# Patient Record
Sex: Male | Born: 1935 | Race: White | Hispanic: No
Health system: Southern US, Community
[De-identification: ages and names within clinical notes are randomized; demographics above are authoritative.]

---

## 2011-01-20 ENCOUNTER — Inpatient Hospital Stay: Payer: Self-pay | Admitting: Specialist

## 2011-02-12 ENCOUNTER — Emergency Department: Payer: Self-pay | Admitting: Unknown Physician Specialty

## 2012-10-21 ENCOUNTER — Emergency Department: Payer: Self-pay | Admitting: Emergency Medicine

## 2012-10-21 LAB — CBC
HGB: 18.6 g/dL — ABNORMAL HIGH (ref 13.0–18.0)
MCH: 32.4 pg (ref 26.0–34.0)
MCHC: 34.3 g/dL (ref 32.0–36.0)
MCV: 95 fL (ref 80–100)
Platelet: 176 10*3/uL (ref 150–440)
RBC: 5.74 10*6/uL (ref 4.40–5.90)
RDW: 12.9 % (ref 11.5–14.5)

## 2012-10-21 LAB — COMPREHENSIVE METABOLIC PANEL
Albumin: 4.2 g/dL (ref 3.4–5.0)
Anion Gap: 9 (ref 7–16)
Calcium, Total: 9.4 mg/dL (ref 8.5–10.1)
Chloride: 103 mmol/L (ref 98–107)
Co2: 26 mmol/L (ref 21–32)
Creatinine: 1.21 mg/dL (ref 0.60–1.30)
EGFR (African American): >60
Glucose: 99 mg/dL (ref 65–99)
Osmolality: 278 (ref 275–301)
Potassium: 4.3 mmol/L (ref 3.5–5.1)
Sodium: 138 mmol/L (ref 136–145)
Total Protein: 8.5 g/dL — ABNORMAL HIGH (ref 6.4–8.2)

## 2012-11-26 LAB — CBC
HCT: 51.1 % (ref 40.0–52.0)
HGB: 17.4 g/dL (ref 13.0–18.0)
MCH: 31.8 pg (ref 26.0–34.0)
MCHC: 34.1 g/dL (ref 32.0–36.0)
MCV: 93 fL (ref 80–100)
Platelet: 172 10*3/uL (ref 150–440)
RBC: 5.47 10*6/uL (ref 4.40–5.90)
WBC: 12 10*3/uL — ABNORMAL HIGH (ref 3.8–10.6)

## 2012-11-26 LAB — COMPREHENSIVE METABOLIC PANEL
Alkaline Phosphatase: 94 U/L (ref 50–136)
Anion Gap: 8 (ref 7–16)
Chloride: 106 mmol/L (ref 98–107)
Co2: 24 mmol/L (ref 21–32)
Glucose: 121 mg/dL — ABNORMAL HIGH (ref 65–99)
Osmolality: 278 (ref 275–301)
Potassium: 3.7 mmol/L (ref 3.5–5.1)
SGPT (ALT): 25 U/L (ref 12–78)
Sodium: 138 mmol/L (ref 136–145)

## 2012-11-26 LAB — CK TOTAL AND CKMB (NOT AT ARMC)
CK, Total: 83 U/L (ref 35–232)
CK-MB: 2.5 ng/mL (ref 0.5–3.6)

## 2012-11-26 LAB — URINALYSIS, COMPLETE
Glucose,UR: NEGATIVE mg/dL (ref 0–75)
Ketone: NEGATIVE
Protein: 100
Specific Gravity: 1.011 (ref 1.003–1.030)
Squamous Epithelial: NONE SEEN
WBC UR: 392 /HPF (ref 0–5)

## 2012-11-26 LAB — TROPONIN I: Troponin-I: 0.2 ng/mL — ABNORMAL HIGH

## 2012-11-27 ENCOUNTER — Inpatient Hospital Stay: Payer: Self-pay | Admitting: Internal Medicine

## 2012-11-27 LAB — BASIC METABOLIC PANEL
Calcium, Total: 8.3 mg/dL — ABNORMAL LOW (ref 8.5–10.1)
Co2: 25 mmol/L (ref 21–32)
Creatinine: 1.19 mg/dL (ref 0.60–1.30)
EGFR (Non-African Amer.): 59 — ABNORMAL LOW
Glucose: 149 mg/dL — ABNORMAL HIGH (ref 65–99)

## 2012-11-27 LAB — CBC WITH DIFFERENTIAL/PLATELET
Basophil %: 1 %
Lymphocyte #: 2.2 10*3/uL (ref 1.0–3.6)
Lymphocyte %: 20.5 %
MCH: 32.1 pg (ref 26.0–34.0)
MCHC: 34 g/dL (ref 32.0–36.0)
MCV: 94 fL (ref 80–100)
Monocyte #: 0.8 x10 3/mm (ref 0.2–1.0)
Monocyte %: 6.9 %
RBC: 5.17 10*6/uL (ref 4.40–5.90)
RDW: 13.2 % (ref 11.5–14.5)
WBC: 10.9 10*3/uL — ABNORMAL HIGH (ref 3.8–10.6)

## 2012-11-27 LAB — LIPID PANEL
Cholesterol: 165 mg/dL (ref 0–200)
Ldl Cholesterol, Calc: 104 mg/dL — ABNORMAL HIGH (ref 0–100)
VLDL Cholesterol, Calc: 36 mg/dL (ref 5–40)

## 2012-11-27 LAB — TROPONIN I
Troponin-I: 0.22 ng/mL — ABNORMAL HIGH
Troponin-I: 0.15 ng/mL — ABNORMAL HIGH

## 2012-11-27 LAB — CK TOTAL AND CKMB (NOT AT ARMC): CK, Total: 99 U/L (ref 35–232)

## 2012-11-27 LAB — MAGNESIUM: Magnesium: 1.8 mg/dL

## 2012-11-28 LAB — BASIC METABOLIC PANEL
Anion Gap: 10 (ref 7–16)
BUN: 20 mg/dL — ABNORMAL HIGH (ref 7–18)
Calcium, Total: 8.5 mg/dL (ref 8.5–10.1)
Chloride: 103 mmol/L (ref 98–107)
Co2: 23 mmol/L (ref 21–32)
Creatinine: 1.47 mg/dL — ABNORMAL HIGH (ref 0.60–1.30)
EGFR (African American): 53 — ABNORMAL LOW
EGFR (Non-African Amer.): 46 — ABNORMAL LOW
Osmolality: 278 (ref 275–301)
Potassium: 4.3 mmol/L (ref 3.5–5.1)
Sodium: 136 mmol/L (ref 136–145)

## 2012-11-29 LAB — URINE CULTURE

## 2012-11-29 LAB — CREATININE, SERUM
EGFR (African American): 54 — ABNORMAL LOW
EGFR (Non-African Amer.): 46 — ABNORMAL LOW

## 2012-12-08 LAB — URINALYSIS, COMPLETE
Glucose,UR: NEGATIVE mg/dL (ref 0–75)
Nitrite: NEGATIVE
RBC,UR: 88 /HPF (ref 0–5)
Squamous Epithelial: <1

## 2012-12-08 LAB — COMPREHENSIVE METABOLIC PANEL
Albumin: 3.2 g/dL — ABNORMAL LOW (ref 3.4–5.0)
Alkaline Phosphatase: 61 U/L (ref 50–136)
Anion Gap: 8 (ref 7–16)
Calcium, Total: 9.3 mg/dL (ref 8.5–10.1)
Chloride: 99 mmol/L (ref 98–107)
Co2: 26 mmol/L (ref 21–32)
Creatinine: 1.37 mg/dL — ABNORMAL HIGH (ref 0.60–1.30)
EGFR (African American): 58 — ABNORMAL LOW
EGFR (Non-African Amer.): 50 — ABNORMAL LOW
Glucose: 89 mg/dL (ref 65–99)
Osmolality: 269 (ref 275–301)
Potassium: 4.2 mmol/L (ref 3.5–5.1)
SGOT(AST): 15 U/L (ref 15–37)
Total Protein: 7.8 g/dL (ref 6.4–8.2)

## 2012-12-08 LAB — CBC
HGB: 16 g/dL (ref 13.0–18.0)
MCHC: 35.1 g/dL (ref 32.0–36.0)
Platelet: 162 10*3/uL (ref 150–440)
RBC: 4.89 10*6/uL (ref 4.40–5.90)
RDW: 13.1 % (ref 11.5–14.5)
WBC: 13.6 10*3/uL — ABNORMAL HIGH (ref 3.8–10.6)

## 2012-12-08 LAB — TROPONIN I: Troponin-I: <0.02 ng/mL

## 2012-12-09 ENCOUNTER — Inpatient Hospital Stay: Payer: Self-pay | Admitting: Student

## 2012-12-10 LAB — CBC WITH DIFFERENTIAL/PLATELET
Basophil #: 0 10*3/uL (ref 0.0–0.1)
Basophil %: 0 %
Eosinophil #: 0 10*3/uL (ref 0.0–0.7)
Eosinophil %: 0 %
HCT: 40.6 % (ref 40.0–52.0)
Lymphocyte #: 0.5 10*3/uL — ABNORMAL LOW (ref 1.0–3.6)
Monocyte #: 0.5 x10 3/mm (ref 0.2–1.0)
Monocyte %: 3.2 %
Neutrophil #: 14.8 10*3/uL — ABNORMAL HIGH (ref 1.4–6.5)
Platelet: 160 10*3/uL (ref 150–440)
RDW: 13.2 % (ref 11.5–14.5)
WBC: 15.8 10*3/uL — ABNORMAL HIGH (ref 3.8–10.6)

## 2012-12-10 LAB — COMPREHENSIVE METABOLIC PANEL
Albumin: 2.9 g/dL — ABNORMAL LOW (ref 3.4–5.0)
Anion Gap: 7 (ref 7–16)
Bilirubin,Total: 0.4 mg/dL (ref 0.2–1.0)
Chloride: 98 mmol/L (ref 98–107)
Co2: 26 mmol/L (ref 21–32)
Creatinine: 1.97 mg/dL — ABNORMAL HIGH (ref 0.60–1.30)
EGFR (African American): 37 — ABNORMAL LOW
Glucose: 154 mg/dL — ABNORMAL HIGH (ref 65–99)
Potassium: 4.5 mmol/L (ref 3.5–5.1)
Sodium: 131 mmol/L — ABNORMAL LOW (ref 136–145)

## 2012-12-11 LAB — BASIC METABOLIC PANEL
Anion Gap: 5 — ABNORMAL LOW (ref 7–16)
Creatinine: 1.44 mg/dL — ABNORMAL HIGH (ref 0.60–1.30)
EGFR (Non-African Amer.): 47 — ABNORMAL LOW
Glucose: 149 mg/dL — ABNORMAL HIGH (ref 65–99)
Osmolality: 283 (ref 275–301)
Potassium: 4.8 mmol/L (ref 3.5–5.1)

## 2012-12-11 LAB — CBC WITH DIFFERENTIAL/PLATELET
Basophil #: 0.1 10*3/uL (ref 0.0–0.1)
Basophil %: 0.5 %
Eosinophil %: 0 %
Lymphocyte #: 0.5 10*3/uL — ABNORMAL LOW (ref 1.0–3.6)
MCHC: 34.5 g/dL (ref 32.0–36.0)
Monocyte #: 0.4 x10 3/mm (ref 0.2–1.0)
Monocyte %: 3.2 %
Neutrophil #: 11.7 10*3/uL — ABNORMAL HIGH (ref 1.4–6.5)
Neutrophil %: 92.7 %
RBC: 4.34 10*6/uL — ABNORMAL LOW (ref 4.40–5.90)
WBC: 12.6 10*3/uL — ABNORMAL HIGH (ref 3.8–10.6)

## 2012-12-12 LAB — BASIC METABOLIC PANEL
Anion Gap: 5 — ABNORMAL LOW (ref 7–16)
Calcium, Total: 8.7 mg/dL (ref 8.5–10.1)
Chloride: 108 mmol/L — ABNORMAL HIGH (ref 98–107)
Creatinine: 1.27 mg/dL (ref 0.60–1.30)
EGFR (African American): >60
EGFR (Non-African Amer.): 55 — ABNORMAL LOW
Glucose: 98 mg/dL (ref 65–99)
Osmolality: 280 (ref 275–301)
Sodium: 137 mmol/L (ref 136–145)

## 2013-01-01 ENCOUNTER — Ambulatory Visit: Payer: Self-pay | Admitting: Nurse Practitioner

## 2013-01-31 ENCOUNTER — Ambulatory Visit: Payer: Self-pay | Admitting: Nurse Practitioner

## 2013-01-31 DEATH — deceased

## 2013-07-23 IMAGING — US US RENAL KIDNEY
1 series · 14 of 25 positions shown · non-contrast
Comparison: none

REASON FOR EXAM: urinary retention
COMMENTS:

[Series 1: us renal kidney · 0.25mm/px · 14 of 40 slices shown]
[im 1/40]
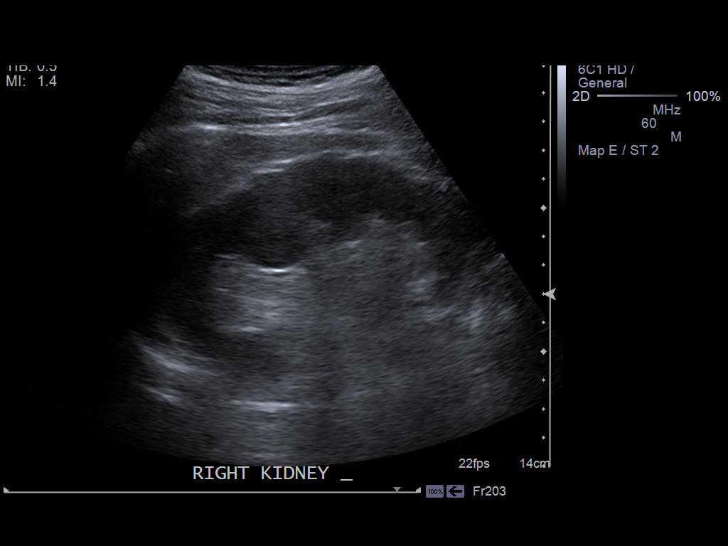
[im 4/40]
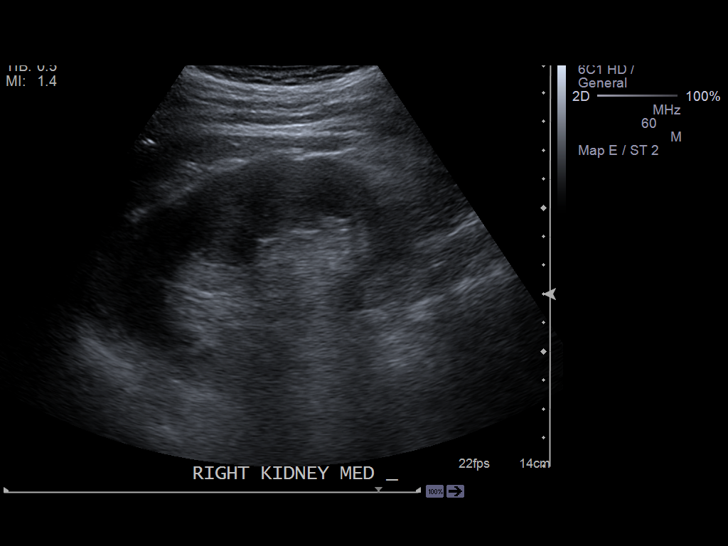
[im 7/40]
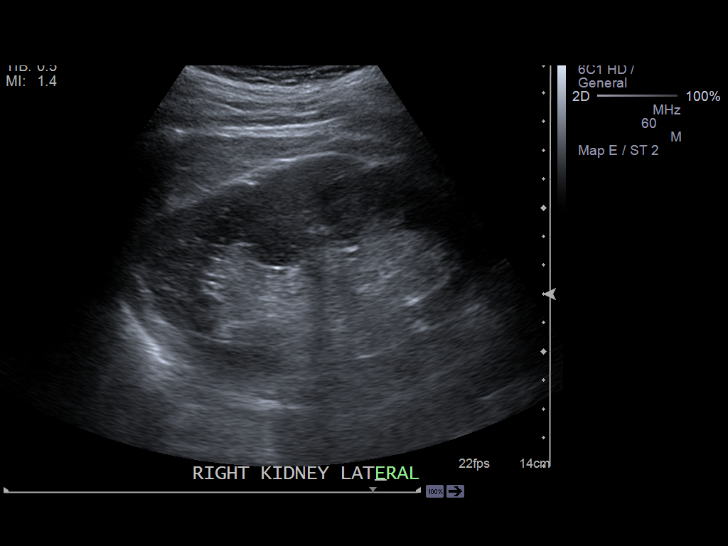
[im 10/40]
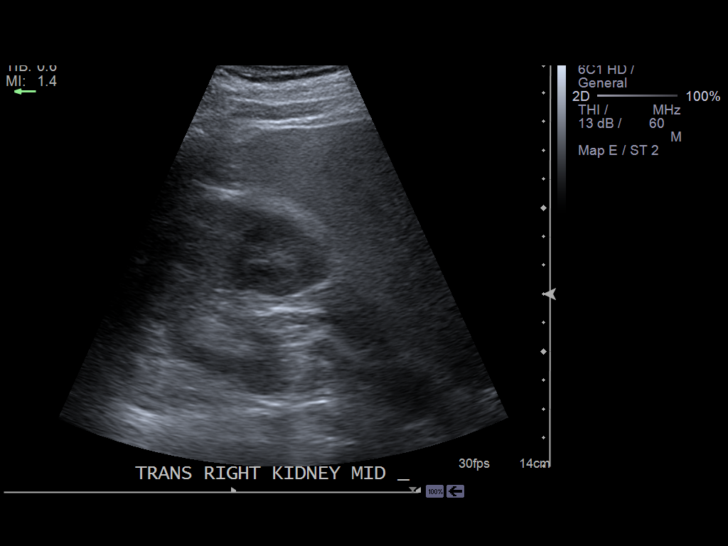
[im 14/40]
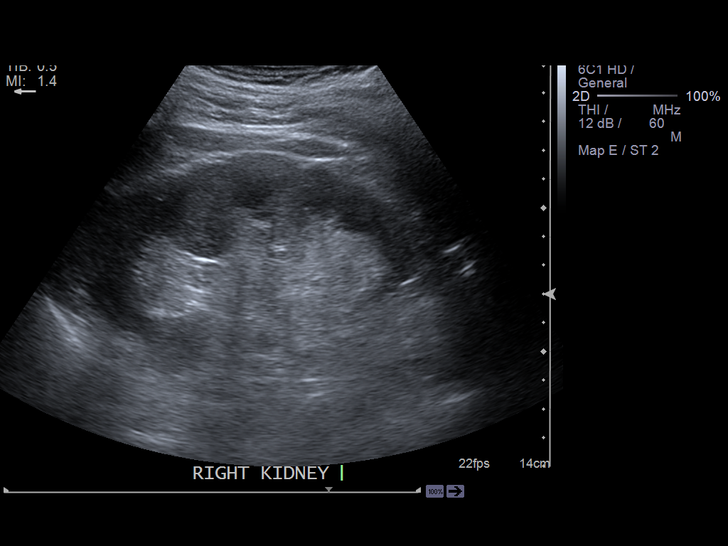
[im 15/40]
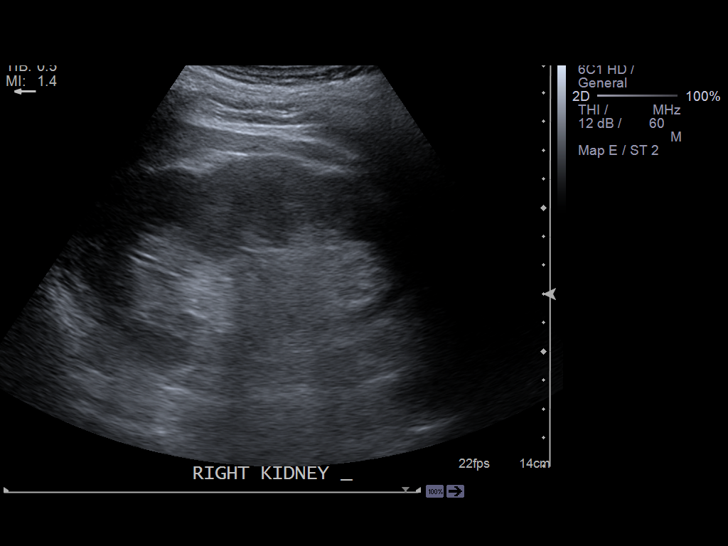
[im 18/40]
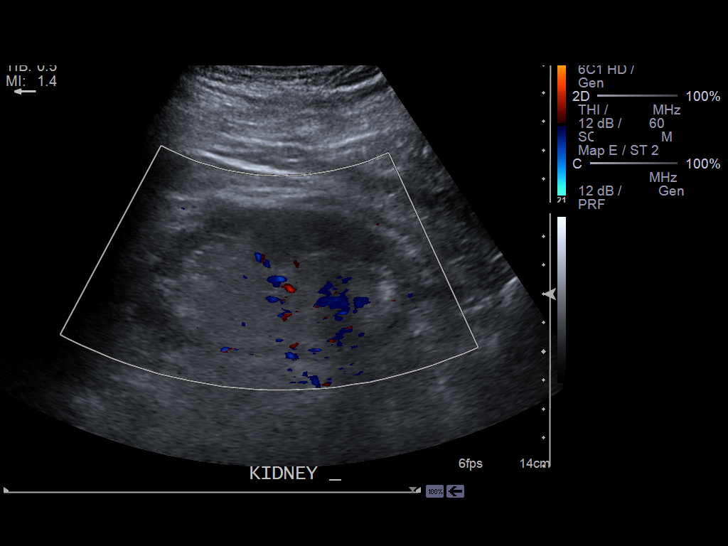
[im 22/40]
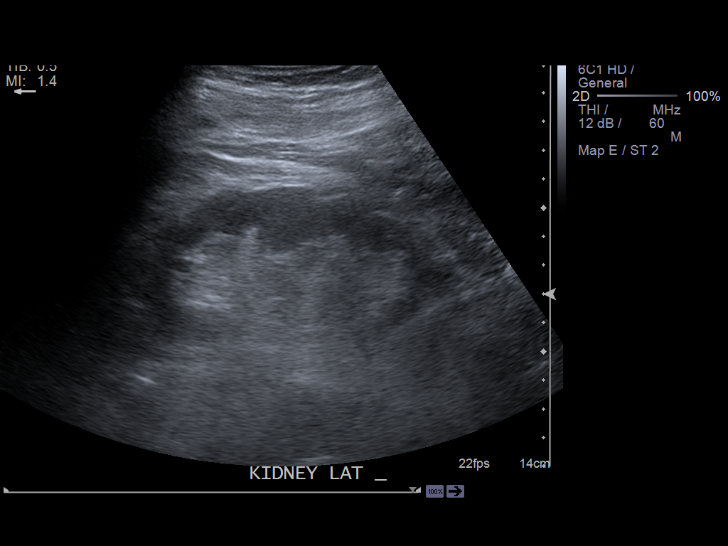
[im 25/40]
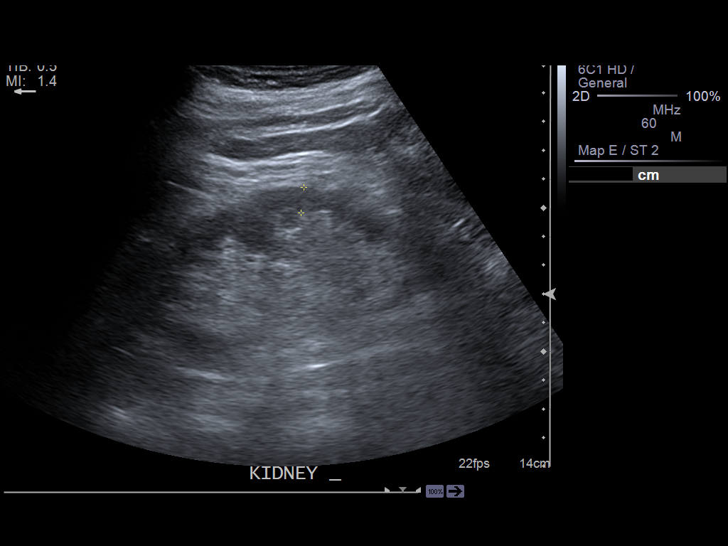
[im 27/40]
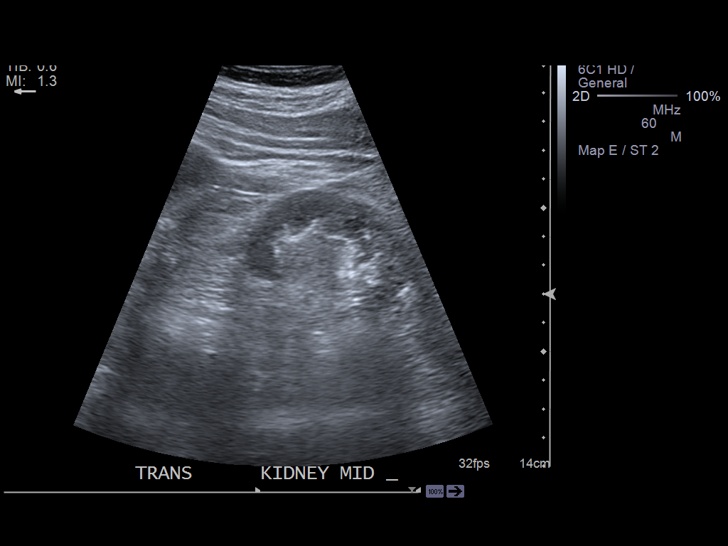
[im 30/40]
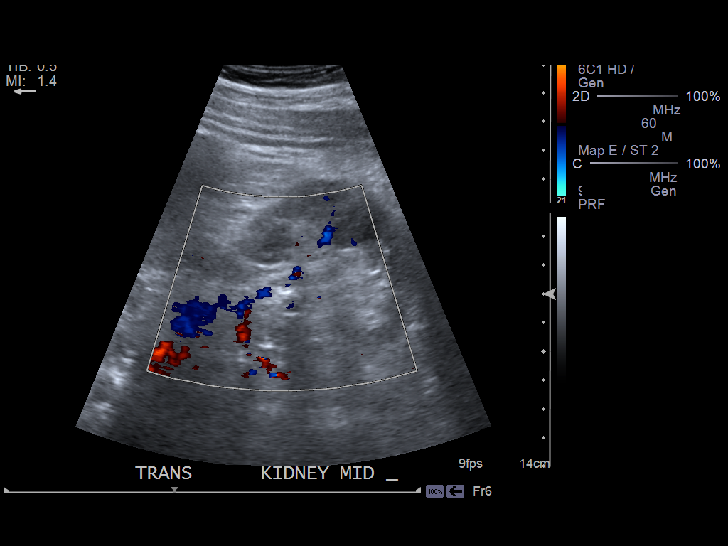
[im 33/40]
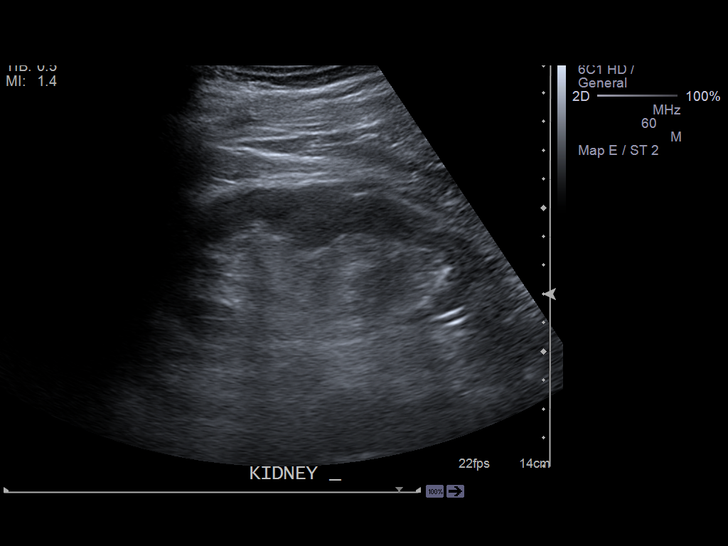
[im 36/40]
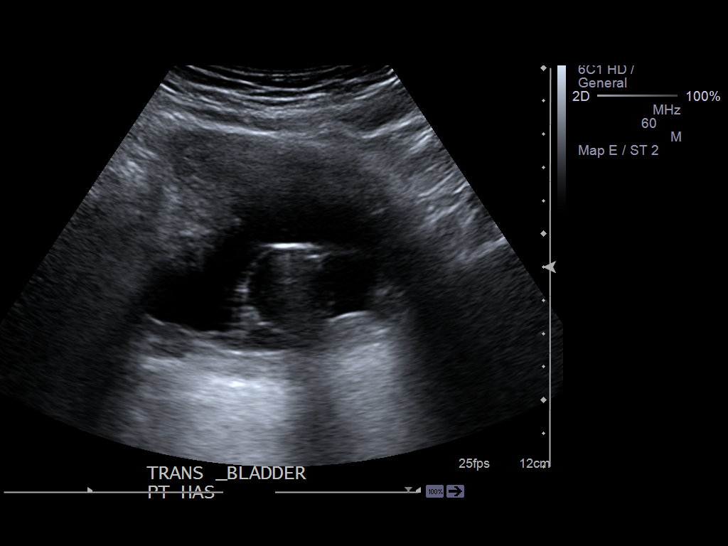
[im 40/40]
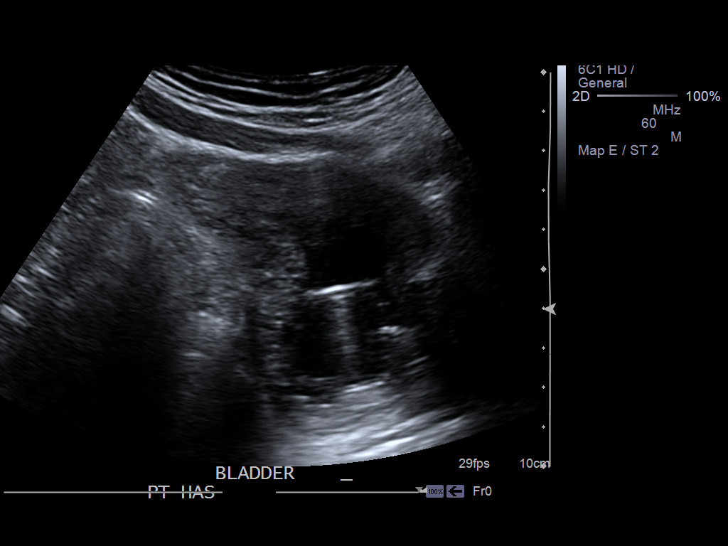

[14 of 25 positions shown; findings below may reference images not displayed]

PROCEDURE:     US  - US KIDNEY  - November 27, 2012 [DATE]

RESULT:     Comparison: None

Technique and findings: Multiple gray-scale and color doppler images of the
kidneys were obtained.

The right kidney measures 11.3 x 5 x 5.8 cm and the left kidney measures 11
x 3.7 x 5.6 cm. And the right kidney is normal in echogenicity. The left
kidney is slightly increased in echogenicity. There is mild left renal
cortical thinning. There is no hydronephrosis.  There are no echogenic foci.
 There are no renal masses. There is no free fluid in the region of the
renal fossa.

The bladder is nondistended limiting evaluation. There is a Foley catheter
present within the bladder.
IMPRESSION: Mild left renal atrophy. No obstructive uropathy.

[REDACTED]

## 2013-08-03 IMAGING — CT CT CHEST W/ CM
1 series · 15 of 32 positions shown, 19 images · IV contrast (APPLIED)
Comparison: None

REASON FOR EXAM: dyspnea, cough, tachypnea
COMMENTS:

PROCEDURE:     CT  - CT CHEST (FOR PE) W  - December 08, 2012  [DATE]
RESULT:      Indications: Chest Pain
TECHNIQUE: A thin-section spiral CT from the lung apices to the upper
abdomen was acquired on a multi slice scanner following 100ml [id number]
intravenous contrast. These images were then transferred to the Siemens work
station and were subsequently reviewed utilizing 3-D reconstructions and MIP
images.

[Series 4: soft tissue · axial · 0.76mm/px · z∈[+688,+970]mm · 15 of 106 slices shown, 19 images]
[im 8/106  mediastinal]
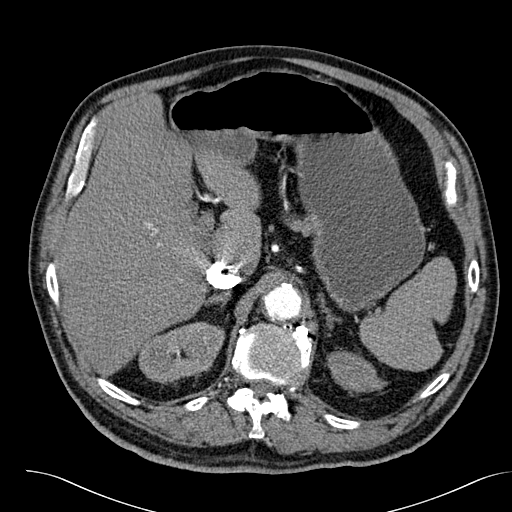
[im 8/106  lung]
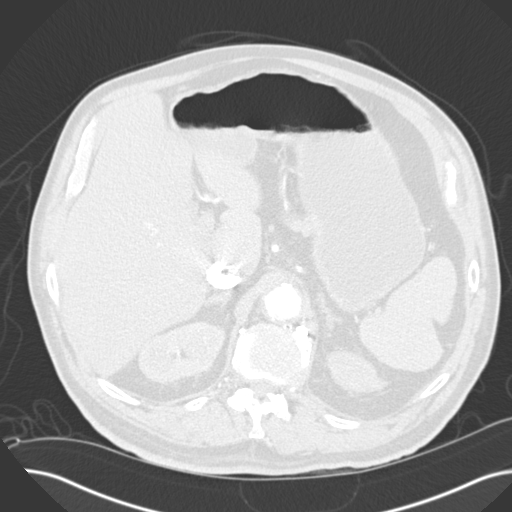
[im 16/106  lung]
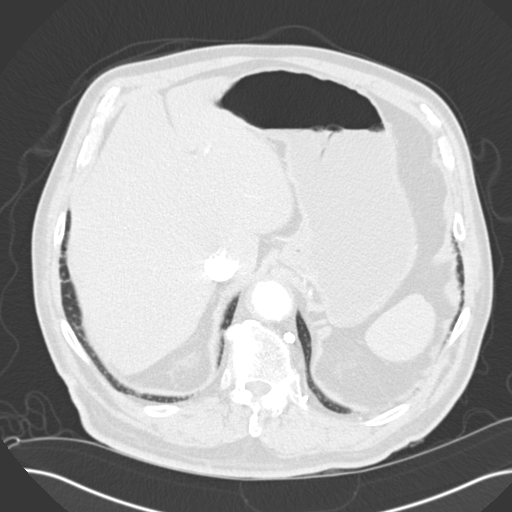
[im 22/106  lung]
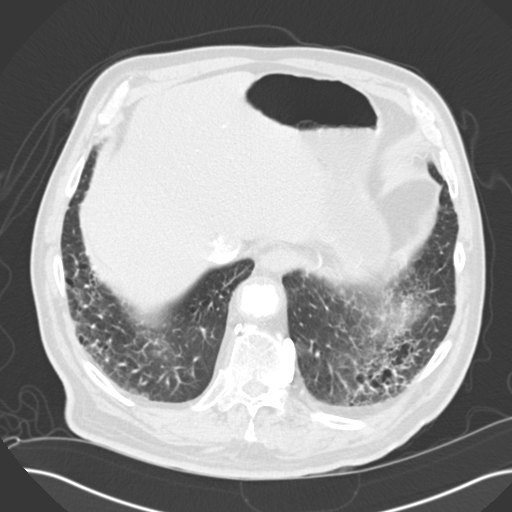
[im 28/106  lung]
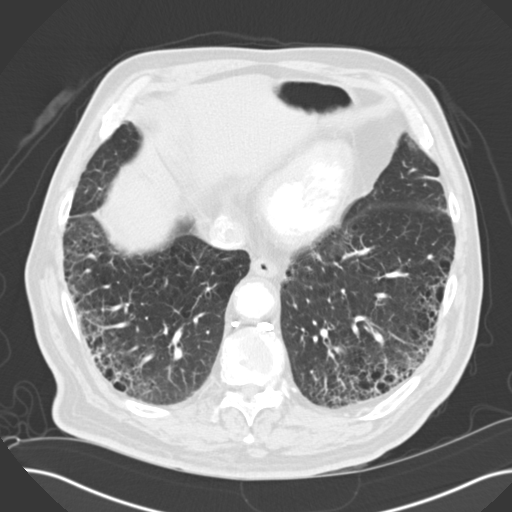
[im 36/106  mediastinal]
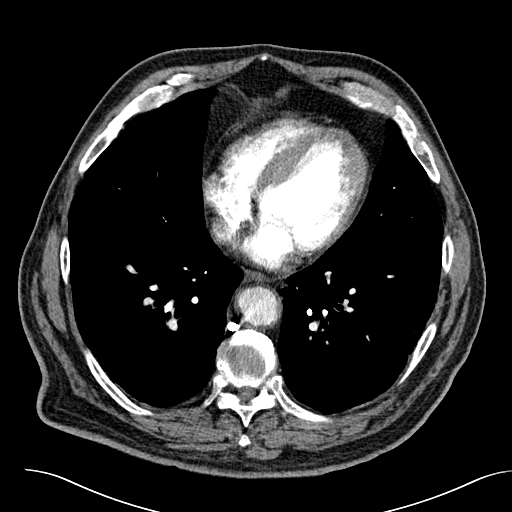
[im 36/106  lung]
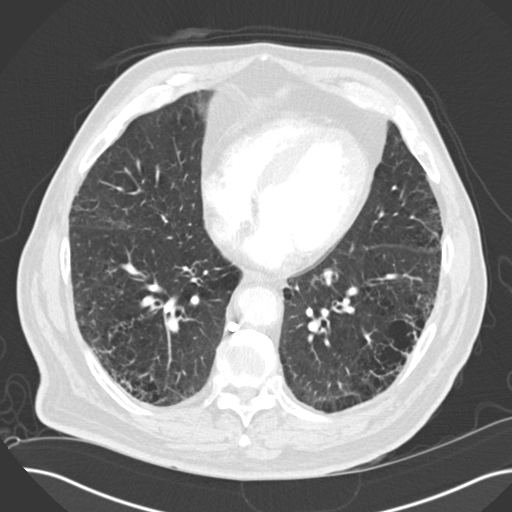
[im 43/106  lung]
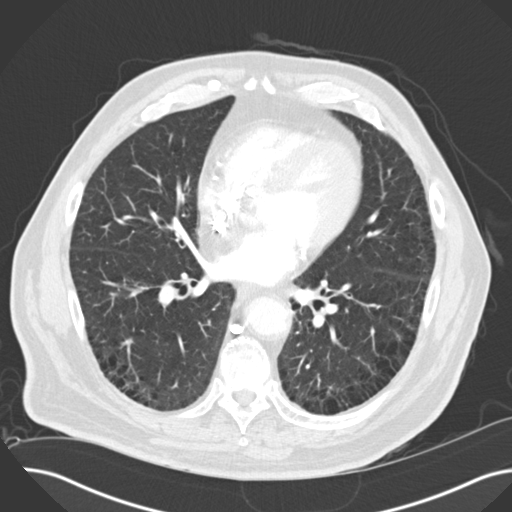
[im 51/106  lung]
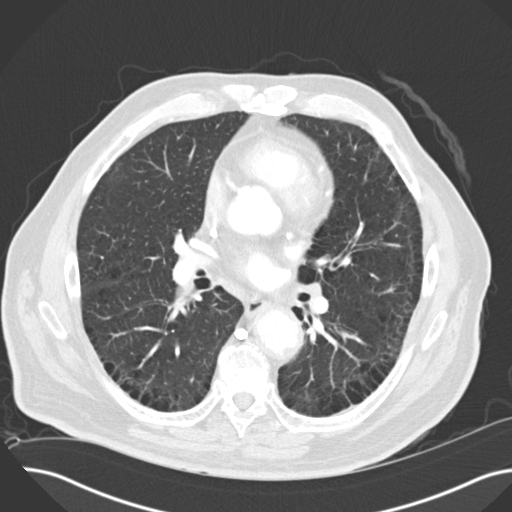
[im 56/106  lung]
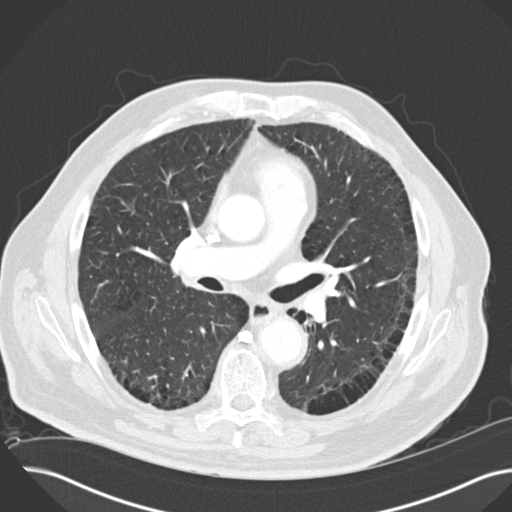
[im 63/106  mediastinal]
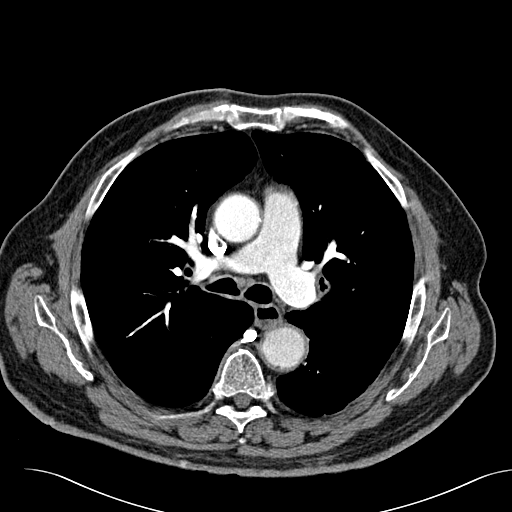
[im 63/106  lung]
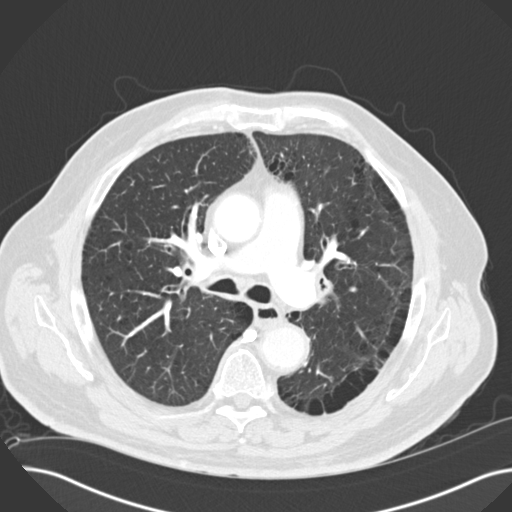
[im 67/106  lung]
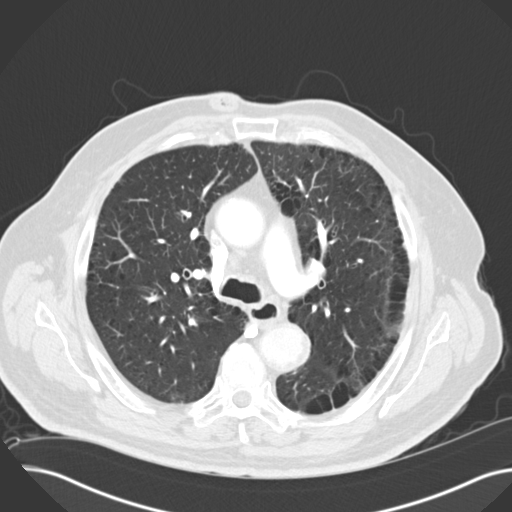
[im 74/106  lung]
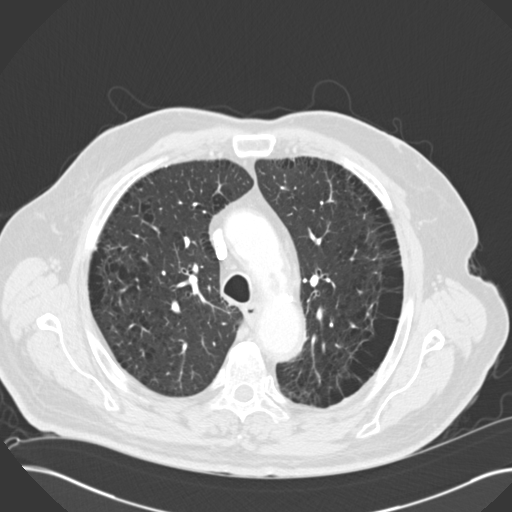
[im 82/106  lung]
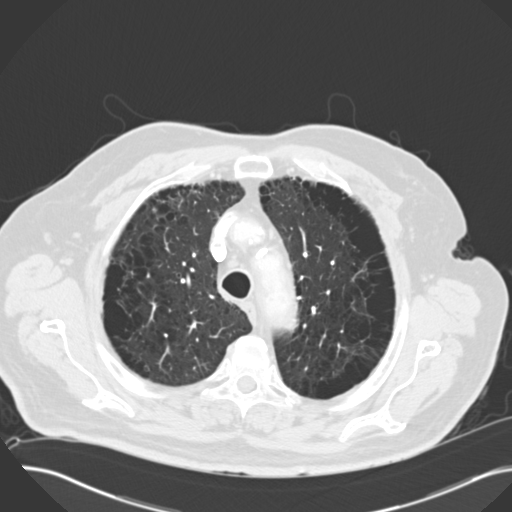
[im 86/106  mediastinal]
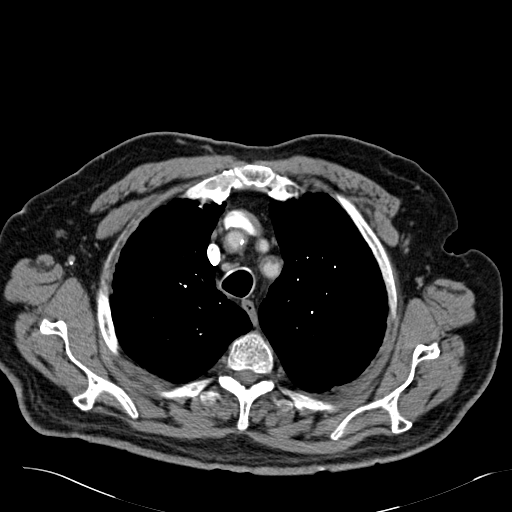
[im 86/106  lung]
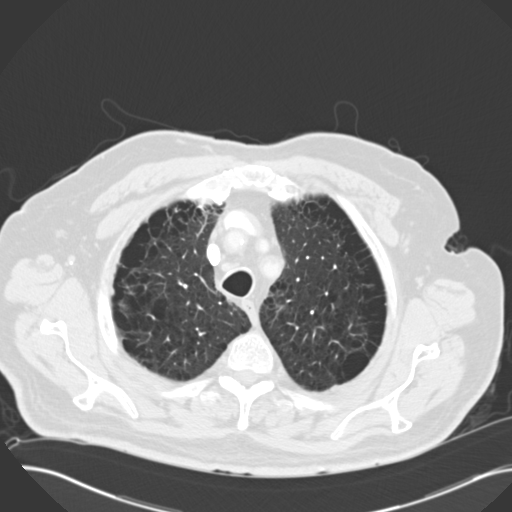
[im 94/106  lung]
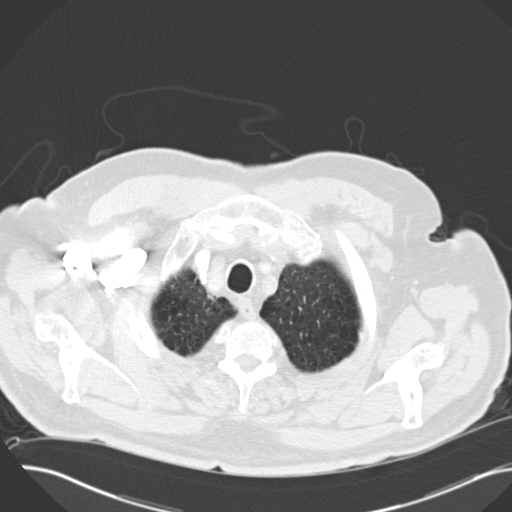
[im 102/106  lung]
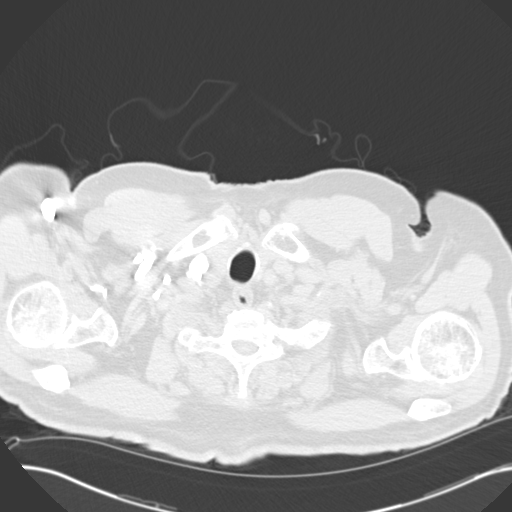

[15 of 32 positions shown; findings below may reference images not displayed]

FINDINGS: There is suboptimal opacification of the pulmonary arteries for evaluation
of pulmonary embolus. The main pulmonary artery, right main pulmonary
artery, and left main pulmonary arteries are normal in size. The heart size
is normal. There is no pericardial effusion. There is severe carotid artery
atherosclerosis.

There are bilateral emphysematous changes. There is bilateral subpleural
cystic change, left greater than right. There is bibasilar interstitial
fibrosis. There is no focal consolidation, pleural effusion, or pneumothorax.

There is no axillary, hilar, or mediastinal adenopathy.

The osseous structures are unremarkable.

There is a 2 cm nodule with air within it in the subcutaneous fat of the
anterior chest wall just to the right of the sternum which may represent an
infected sebaceous cyst; correlate with clinical exam.

The visualized portions of the upper abdomen are unremarkable.
IMPRESSION: 1. There is suboptimal opacification of the pulmonary arteries for
evaluation of pulmonary embolus.

2. Bilateral emphysematous changes.

3. Bilateral interstitial fibrosis.

4. Coronary artery disease.

5. There is a 2 cm nodule with air within it in the subcutaneous fat of the
anterior chest wall just to the right of the sternum which may represent an
infected sebaceous cyst; correlate with clinical exam.

[REDACTED]

## 2014-11-23 NOTE — H&P (Signed)
PATIENT NAME:  Omar Newton, Omar Newton MR#:  [id number] DATE OF BIRTH:  [birth year]  DATE OF ADMISSION:  11/27/2012  PRIMARY CARE PHYSICIAN:  Nonlocal.   CARDIOLOGY:  Dr. Gwen Pounds.   REFERRING PHYSICIAN:  Dr. Joselyn Glassman.   HISTORY OF PRESENT ILLNESS:  The patient is a 81 year old male with a past medical history of acute MI in the past status post stents, sees Dr. Gwen Pounds as an outpatient, is presenting to the ER with a chief complaint of progressively worsening of generalized weakness.  Lately he has been coughing more and lying in the bed.  According to the daughter he cannot get around as he used to do in the past.  The patient denies any chest pain, abdominal pain, nausea, vomiting or weight gain.  He has chronic shortness of breath and he is reporting that he is at his baseline.  The patient admits that he still smokes 2 packs of cigarettes per day.  The patient's urinalysis has revealed 3+ leukocyte esterase and negative nitrate.  His white count is elevated at 12.0.  Troponin is elevated at 0.20.  CPK MB and CK total are normal.  A 12-lead EKG has revealed new left anterior fascicular block which is somewhat new when compared to the old EKG from year 2012.  Hospitalist team is called to admit the patient for elevated troponin with generalized weakness and acute cystitis.  The patient has received IV Rocephin in the ER.  During my examination, patient denies any chest pain or abdominal pain.  Just feeling weak and tired.   The patient's daughter is reporting that he ran out of his medications approximately 2 to 3 weeks ago and not taking his medications.  The patient is admitting some financial issues to get his medications.  PAST MEDICAL HISTORY:  Coronary artery disease status post myocardial infarction x 2 and status post stent.  Hypertension, benign prostatic hypertrophy, neuropathy, hyperlipidemia, GERD and obesity.   PAST SURGICAL HISTORY:  Status post stents in the past.   ALLERGIES:  He has no known  drug allergies.   HOME MEDICATIONS:  Tamsulosin 0.4 mg 1 capsule by  mouth once daily, ranitidine 150 mg 2 times a day, Percocet 10/325 1 tablet by mouth q. 6 hours, gemfibrozil 600 mg 2 times a day, gabapentin 300 mg 3 times a day, furosemide 20 mg 1 tablet once daily, Avodart 0.5 mg 1 capsule by mouth once daily, atenolol 20 mg once daily, aspirin 81 mg once daily.   PSYCHOSOCIAL HISTORY:  Lives at home with the daughter, smokes 2 packs a day.  Denies any street drugs.  Denies any alcohol.   FAMILY HISTORY:  Diabetes runs in his family.  Both parents are deceased.   REVIEW OF SYSTEMS:  CONSTITUTIONAL:  Complaining of fatigue and generalized weakness.  No weight loss or weight gain.  EYES:  No blurry vision, glaucoma, cataracts.  EARS, NOSE, THROAT:  Denies any epistaxis, discharge, snoring, postnasal drip.  RESPIRATION:  Has chronic shortness of breath, problems with worsening of cough, chronic history of COPD is present.  CARDIOVASCULAR:  No chest pain, palpitations, syncope.  GASTROINTESTINAL:  No nausea, vomiting, diarrhea.  Has history of GERD.  GENITOURINARY:  No dysuria, hematuria, renal calculi. ENDOCRINE:  No polyuria, nocturia, thyroid problems.  Denies any heat or cold intolerance.  HEMATOLOGIC AND LYMPHATIC:  No anemia, easy bruising or bleeding.  INTEGUMENTARY:  No acne, rash.  The patient has decubitus ulcers stage I to II on his lower extremities on the pressure  point area.  NEUROLOGY:  No vertigo, ataxia, dementia, dysarthria, weakness.  PSYCHIATRY:  No anxiety, insomnia, ADD or OCD.   PHYSICAL EXAMINATION: VITAL SIGNS:  Temperature 97.4, pulse 88, respirations 20, blood pressure is 137/60, pulse ox 95%.  GENERAL APPEARANCE:  Not under acute distress.  Moderately built and moderately nourished.  HEENT:  Normocephalic, atraumatic.  Pupils are equally reacting to light and accommodation.  No scleral icterus.  No conjunctival injection.  Extraocular movements are intact.  No  sinus tenderness.  No postnasal drip.  No pharyngeal exudates.  NECK:  Supple.  No JVD.  No thyromegaly.  No lymphadenopathy.  LUNGS:  Course bronchial breath sounds are present.  Positive crackles, more on the right.  No anterior chest wall tenderness.  No accessory muscle usage.  CARDIOVASCULAR:  S1, S2 normal.  Regular rate and rhythm.  No edema.  GASTROINTESTINAL:  Soft.  Bowel sounds are positive in all four quadrants.  Nontender, nondistended.  No masses felt.  No hepatosplenomegaly.  NEUROLOGIC:  Awake, alert, oriented x 3.  Cranial nerves II through XII are grossly intact.  Motor and sensory are grossly intact.  Reflexes are 2+.  INTEGUMENTARY:  No rashes.  The right lateral malleolus of the foot healing pressure ulcer is noticed with surrounding erythema, also stage I ulcers are noticed on both feet on the pressure point areas.  Skin is warm to touch.  Normal turgor.  EXTREMITIES:  No cyanosis.  No clubbing.  No edema.  PSYCHIATRIC:  Normal mood and affect.  MUSCULOSKELETAL:  No joint effusion, tenderness or erythema.   LABORATORY AND IMAGING STUDIES:  Chest x-ray, early developing infiltrate on the left side of the chest is noticed.  CP angles are intact, chronic COPD changes were noticed.  A 12-lead EKG has revealed sinus rhythm with occasional premature ventricular complexes, heart rate is 89 beats per minute.  The patient has persistent right bundle branch block when compared to the old EKG.  Left anterior fascicular block is noticed which is somewhat new when compared to the old EKG.  No acute ST elevations or depressions noticed.  The patient's BUN is 17, creatinine is 1.23.  Glucose 121, sodium 138, potassium 3.7, chloride 106, CO2 24, anion gap is 8, GFR is 57.  Serum osmolality 278, calcium 8.8.  CK total 83, CPK-MB 2.5.  Troponin 0.20.  LFTs are within normal range.  WBC is elevated at 12.0, hemoglobin 17.4, hematocrit 51.1, platelet count is 172.  Urinalysis yellow in color, cloudy in  appearance.  Glucose, bilirubin, ketones are negative, specific gravity 1.011, nitrites negative, leukocyte esterase 3+, bacteria 1+.  WBC clumps are present.   ASSESSMENT AND PLAN:  A 81 year old male presenting to the ER with a chief complaint of progressively worsening weakness and cough.  He admits that he ran out of medication for the past 2 to 3 weeks and he could not buy them given the financial issues. 1.  Generalized weakness, probably from acute cystitis and early developing pneumonia.  Plan is to put him on telemetry.  We will give him IV Rocephin and Zithromax.  Pan cultures were ordered.  We will provide him inhalers as-needed basis.  2.  Elevated troponin with a history of acute coronary syndrome, status post myocardial infarction and stent, probably from demand ischemia.  Given the history of myocardial infarction in the past x 2 and stents, we will cycle cardiac biomarkers.  We will implement acute coronary syndrome protocol with oxygen, nitroglycerin, aspirin, beta-blocker and statin.  A 2-D echocardiogram is ordered.  Cardiology consult is placed to Dr. Gwen Pounds.  The patient has received one dose of Lovenox 1 mg/kg subQ in the ER while pending cardiac biomarker cycling.  3.  Hypertension.  Blood pressure is stable.  We will resume his home medication.  4.  Chronic obstructive pulmonary disease.  The patient was counseled to quit smoking.  Currently chronic obstructive pulmonary disease is stable.  We will provide him inhalers as needed basis for shortness of breath.  5.  Hyperlipidemia.  Continue his home medication gemfibrozil.  We will give him statin and check fasting lipid panel.  6.  Gastroesophageal reflux disease.  The patient will be receiving ranitidine.  7.  Benign prostatic hypertrophy, stable.  Continue his home medications.  8.  Congestive heart failure.  Resume his home medication Lasix.  Currently, patient is not fluid-overloaded.  We will obtain 2-D echocardiogram to  evaluate his ejection fraction.  9.  Gastrointestinal prophylaxis with ranitidine and deep vein thrombosis prophylaxis with Lovenox.  10.  HE IS FULL CODE.   The patient's daughter is his medical power of attorney.   Total time spent on the admission is 50 minutes.     ____________________________ Ramonita Lab, MD ag:ea D: 11/27/2012 00:56:56 ET T: 11/27/2012 02:08:45 ET JOB#: [id number]  cc: Ramonita Lab, MD, <Dictator> Dr. Silvio Clayman MD ELECTRONICALLY SIGNED 12/02/2012 5:14

## 2014-11-23 NOTE — H&P (Signed)
PATIENT NAME:  Omar Newton, Omar Newton MR#:  [id number] DATE OF BIRTH:  Mar 12, 1934  DATE OF ADMISSION:  12/09/2012  PRIMARY CARE PHYSICIAN:  Nonlocal.  REFERRING PHYSICIAN:  Dr. Manson Passey.  CARDIOLOGY:  Dr. Gwen Pounds.  CHIEF COMPLAINT:  Shortness of breath associated with generalized weakness and coughing.    HISTORY OF PRESENT ILLNESS:  The patient is a 81 year old male with a chronic history of COPD, still smokes 2 packs, presenting to the ER with a chief complaint of shortness of breath associated with generalized weakness and cough.  The patient was just admitted and discharged from the hospital on April 27th.  He was treated for acute cystitis at that time.  The patient is reporting that he was doing okay for the 2 to 3 days after he got discharged, but following that he started having cough and generalized weakness and eventually developed shortness of breath.  Denies any chest pain, abdominal pain, nausea, vomiting, diarrhea.  Denies any similar complaints in the past.    PAST MEDICAL HISTORY:  Coronary artery disease status post MI and 2 stents, hypertension, benign prostatic hypertrophy, hyperlipidemia and GERD  PAST SURGICAL HISTORY:  Status post 2 stent placement in the heart.   ALLERGIES:  No known drug allergies.  HOME MEDICATIONS:  Nitroglycerin 0.4 mg sublingual q. 5 minutes x 3 as needed, lisinopril 5 mg once daily, gemfibrozil 600 mg 2 times a day, gabapentin 300 mg 3 times a day, furosemide 20 mg once daily, Avodart 0.5 mg one capsule by mouth once daily, atenolol 25 mg once a day, aspirin 81 mg once a day, Advair 250/50 1 puff inhalation 2 times a day, acetaminophen 1 tablet by mouth q. 6 hours.    PSYCHOSOCIAL HISTORY:  Lives at home with daughter.  Smokes 2 packs a day.  Denies alcohol or illicit drug usage.    FAMILY HISTORY:  Diabetes runs in his family.    REVIEW OF SYSTEMS:   CONSTITUTIONAL:  Complaining of fatigue, generalized weakness, but no weight loss or weight gain.  EYES:   Denies any blurry vision, glaucoma or cataracts.  EARS, NOSE, THROAT:  No discharge, snoring, postnasal drip, sinus pain, difficulty in swallowing. RESPIRATION:  Complaining of cough, chronic history of COPD.  No painful respiration. CARDIOVASCULAR:  No chest pain, palpitations or syncope. GASTROINTESTINAL:  No nausea, vomiting, diarrhea, GERD. GENITOURINARY:  No dysuria, hematuria, renal calculi. MALE GENITOURINARY SYSTEM:  Denies any STD or HIV. ENDOCRINE:  No polyuria, nocturia or thyroid problems.  Denies any increased sweating. HEMATOLOGIC AND LYMPHATIC:  No anemia, easy bruising.  INTEGUMENTARY:  No acne, rash, lesions.  MUSCULOSKELETAL:  No joint pain in the neck, back, shoulder, knee, hip.  Denies any gout. NEUROLOGIC:  Denies any vertigo, ataxia, dementia.  PSYCHIATRIC:  Denies any insomnia, ADD, OCD.  PHYSICAL EXAMINATION:  VITAL SIGNS:  Temperature 99.1, pulse 101, respirations 22, blood pressure 121/62, pulse ox 98%.  GENERAL APPEARANCE:  Not under acute distress, moderately build and moderately nourished.  HEENT:  Normocephalic, atraumatic.  Pupils are reactive to light and accommodation.  No scleral icterus.  No conjunctival injection.  Extraocular movements are intact.  No sinus tenderness.  No post nasal drip.   NECK:  Supple.  No JVD.  No thyromegaly.  No lymphadenopathy.  LUNGS:  Coarse bronchial breath sounds are heard bilaterally.  No anterior chest wall tenderness on palpation.  No accessory muscle usage.  CARDIOVASCULAR:  S1, S2 normal.  Regular rate and rhythm.  No edema.   GASTROINTESTINAL:  Soft, bowel sounds are positive in all 4 quadrants, nontender, nondistended.  No masses felt.  No hepatosplenomegaly.  NEUROLOGIC:  Awake, alert, oriented x 3.  Cranial nerves II through XII are grossly intact.  Motor and sensory are intact.  Reflexes are 2+.   INTEGUMENTARY:  No rashes.  No lesions.  Normal turgor.  Warm to touch.   EXTREMTIIES:  No cyanosis, no clubbing, no  edema. PSYCHIATRIC:  Normal mood and affect.   MUSCULOSKELETAL:  No joint effusion, tenderness or erythema.    LABORATORY AND IMAGING STUDIES:  CT of the chest with contrast has revealed suboptimal opacification of the pulmonary arteries for evaluation of pulmonary embolism, bilateral emphysematous changes.  Bilateral interstitial fibrosis.  Coronary artery disease.  The patient's WBC is elevated at 13.6, hemoglobin 16.0, hematocrit 45.4, platelets 163.  Urinalysis, yellow in color, clear in appearance, blood 2+, but nitrites and leukocyte esterase are negative.  ABG, pH 7.48, pCO2 35, pO2 53, FiO2 21, base excess 2.8, bicarb is 26.1.  Total protein 7.8, albumin 3.2, bili total 0.7, alkaline phosphatase 61, AST is 15, ALT is 14.  Troponin T less than 0.02.  Glucose is 89, BUN 21, creatinine 1.37, sodium 133, potassium 4.0, chloride 99, GFR is 60, serum osmolality 269, calcium 9.3.   ASSESSMENT AND PLAN:  A 81 year old Caucasian male presented to the ER with generalized weakness, shortness of breath and cough, but still smoking, will be admitted with the following assessment and plan. 1.  Shortness of breath with weakness and cough, probably from acute exacerbation of chronic obstructive pulmonary disease.  The patient still is smoking.  We will give him Solu-Medrol, DuoNeb and neb treatments.  The patient will be on IV levofloxacin. 2.  Coronary artery disease.  Resume his home medication. 3.  Hypertension.  We will hold off on lisinopril in view of acute renal injury.  4.  Acute kidney injury.  We will hold off Lasix and lisinopril.  Monitor renal function closely.  If necessary, we will put a consult to nephrology.   5.  Hyperlipidemia.  Continue with statin. 6.  Gastroesophageal reflux disease.  Continue proton pump inhibitor. 7.  Chronic history of congestive heart failure.  Lasix is on hold in view of acute renal injury.  We will continue close monitoring.  We will consider resuming Lasix after  renal function is back to normal.   8.  He is FULL CODE. 9.  We will provide him GI and DVT prophylaxis.   Total time spent on admission is 50 minutes.     ____________________________ Ramonita LabAruna Kadarious Dikes, MD ag:ea D: 12/09/2012 01:24:30 ET T: 12/09/2012 03:31:54 ET JOB#: [id number]  cc: Ramonita LabAruna Eligh Rybacki, MD, <Dictator> Ramonita LabARUNA Sion Thane MD ELECTRONICALLY SIGNED 12/12/2012 0:56

## 2014-11-23 NOTE — Discharge Summary (Signed)
PATIENT NAME:  Omar Newton, [name] MR#:  [id number] DATE OF BIRTH:  1933-12-11  DATE OF ADMISSION:  12/09/2012 DATE OF DISCHARGE:  12/12/2012  CHIEF COMPLAINT: Shortness of breath, weakness, cough, diarrhea.   DISCHARGE DIAGNOSES: 1.  Weakness and diarrhea likely secondary to viral gastroenteritis, now resolved.  2.  Acute renal failure likely secondary to gastrointestinal losses and dehydration, now resolved.  3.  Chronic systolic congestive heart failure with ejection fraction about 30% to 35%.  4.  Acute renal failure.  5.  Urinary retention.  6.  Chronic obstructive pulmonary disease exacerbation.  7.  Ongoing tobacco abuse.  8.  History of coronary artery disease status post myocardial infarction and stenting.  9.  Hypertension.  10.  Benign prostatic hypertrophy.  11.  Hyperlipidemia.  12.  Gastroesophageal reflux disease. 13.  Acute respiratory failure with PaO2 of 53 on ABG secondary to chronic obstructive pulmonary disease exacerbation, now resolved and off of oxygen. 14.  Systemic antiinflammatory response syndrome with tachycardia and leukocytosis secondary to chronic obstructive pulmonary disease exacerbation.    DISCHARGE MEDICATIONS:  1.  Levaquin 250 mg daily for 3 days.  2.  Prednisone 40 mg daily for a day and taper x 10 mg daily until done in 4 days.  3.  Avodart 0.5 mg once a day.  4.  Lisinopril 5 mg daily.  5.  Tamsulosin 0.4 mg daily.  6.  Ranitidine 150 mg 2 times a day.  7.  Furosemide 20 mg daily.  8.  Aspirin 81 mg daily.  9.  Simvastatin 20 mg daily.  10.  Gemfibrozil 600 mg 2 times a day.  11.  Gabapentin 300 mg 1 cap 3 times a day.  12.  Nitroglycerin sublingual 0.4 mg every 5 minutes as needed for chest pain.  13.  Advair 250/50 mcg inhaled 1 puff 2 times a day.  14.  Spiriva 18 mcg inhaled 1 cap daily.  15.  Acetaminophen/oxycodone 325/10 mg 1 tab every 6 hours as needed for pain.  16.  Metoprolol succinate 25 mg daily. 17.  Docusate/senna 1 tab 2  times a day.   SPECIAL INSTRUCTIONS:  The patient will be getting discharged with a Foley.   DIET: Low sodium.   ACTIVITY: As tolerated.   DISCHARGE FOLLOWUP: Please follow with Dr. Achilles Dunkope from urology within 1 to 2 weeks for the urinary retention. Please follow with PCP within 1 to 2 weeks.   SIGNIFICANT LABS AND IMAGING: Creatinine on arrival 1.37. Peak creatinine 1.97. Last creatinine 1.27. Initial sodium 133. LFTs on arrival: Albumin 3.2, otherwise within normal limits. Troponin negative. WBC 13.6 on arrival. Last WBC 12.6. Urinalysis not suggestive of infection.   ABG showed pH of 7.48, pO2 of 53 and pCO2 of 35.  CT of chest for PE with contrast shows suboptimal opacification of the pulmonary arteries, bilateral emphysema, bilateral interstitial fibrosis, CAD, 2 cm nodule with air within the sub-Q fat of the anterior chest wall which could be a sebaceous cyst.  WBC on arrival 13.6. Last WBC 12.6.  HISTORY OF PRESENT ILLNESS AND HOSPITAL COURSE:  For full details of H and P, please see the dictation on May 9th by Dr. Amado CoeGouru, but briefly this is a 81 year old male with persistent tobacco abuse, history of COPD and CAD who came in for shortness of breath. He had recently been discharged from the hospital. He came in with acute respiratory failure based on the ABG and needed oxygen on arrival. CT of the chest was done  with PE protocol was done which was more consistent with emphysematous changes and COPD flare with the patient's tachycardia, tachypnea, shortness of breath and dry cough which was likely secondary to acute COPD exacerbation. He was started on Levaquin as well as IV Solu-Medrol, his inhalers were continued, and he was started on nebulizers. The patient did well and at this point is on prednisone taper and 3 days of Levaquin to go. In regards to his weakness and diarrhea this is likely secondary to viral gastroenteritis which has resolved and he has had no diarrhea here and his stool  cultures, which have been ordered, including C. diff, were not sent as there are no stool samples to be sent. In regards to his renal failure, he was started on some gentle fluids. His Lasix was held, in addition to ACE inhibitor, and they have been continued to today, the day of discharge. In regards to his chronic systolic CHF, again his Lasix and ACE inhibitor was held. He does not appear to be in volume overload state and they can be resumed. The patient did have urinary retention and was supposed to see Dr. Achilles Dunk as an outpatient and a trial off of Foley was attempted; however, the patient without the Foley retained urine and Foley had to be reinserted. Therefore, the patient is to follow with Dr. Achilles Dunk is an outpatient within 1 to 2 weeks. He does have SIRS criteria which is likely secondary to COPD flare. That has resolved. At this point, he will be discharged as he has been seen by PT and the recommendation is rehab.   TOTAL TIME SPENT: 33 minutes. ____________________________ Krystal Eaton, MD sa:sb D: 12/12/2012 14:07:10 ET T: 12/12/2012 14:31:56 ET JOB#: [id number]  cc: Krystal Eaton, MD, <Dictator> Krystal Eaton MD ELECTRONICALLY SIGNED 12/26/2012 15:10

## 2014-11-23 NOTE — Discharge Summary (Signed)
PATIENT NAME:  Omar Newton, Omar Newton MR#:  [id number] DATE OF BIRTH:  1933-12-01  DATE OF ADMISSION:  11/27/2012  ADMITTING PHYSICIAN:  Dr. Amado Coe  DATE OF DISCHARGE:  11/29/2012  DISCHARGING PHYSICIAN:  Dr. Enid Baas  PRIMARY PHYSICIAN: At a local clinic, patient is not aware of, and care manager working on setting an outpatient appointment.  CONSULTATIONS IN THE HOSPITAL:  Cardiology consultation with Dr. Lady Gary.   DISCHARGE DIAGNOSES: 1.  Chronic obstructive pulmonary disease exacerbation.  2.  Urinary tract infection.  3.  Systemic inflammatory response syndrome.  4.  Pneumonia/bronchitis.  5.  Ongoing smoking.  6.  Elevated troponin secondary to demand ischemia.  7.  Chronic systolic congestive heart failure, ejection fraction of 30% to 35%.  8.  Urinary retention due to benign prostatic hypertrophy, requiring indwelling Foley catheter.   DISCHARGE MEDICATIONS:  1.  Avodart 0.5 mg p.o. daily.  2.  Lisinopril 5 mg p.o. daily.  3.  Norco 10/325 mg p.o. q. 6 hours p.r.n. for pain.  4.  Flomax 0.4 mg p.o. daily.  5.  Levaquin 500 mg p.o. daily for 4 more days.  6.  Ranitidine 150 mg p.o. b.i.d.  7.  Lasix 20 mg p.o. daily.  8.  Spiriva 1 capsule inhalation daily.  9.  Atenolol 25 mg p.o. daily.  10.  Advair [id number], 1 puff b.i.d.  11.  Aspirin 81 mg p.o. daily.  12.  Simvastatin 20 mg p.o. daily.  13.  Gemfibrozil 600 mg p.o. b.i.d.  14.  Gabapentin 300 mg p.o. t.i.d.  15.  Sublingual nitroglycerin 0.4 mg as needed for chest pain.  16.  Prednisone taper.   DISCHARGE DIET:  Low sodium diet.   DISCHARGE ACTIVITY:  As tolerated.    FOLLOWUP INSTRUCTIONS: 1.  Follow up with Dr. Achilles Dunk for urinary retention on 05/12 at 10:45 a.m.  2.  PCP followup in 1 to 2 weeks.  3.  Home health advised; however, patient refused.   4.  Home oxygen recommended, 2 liters, as he is hypoxic on room air due to COPD. Again, patient refused because he wanted to continue to smoke about 2 packs per  day.   LABS AND IMAGING STUDIES PRIOR TO DISCHARGE: Sodium 136, potassium 4.3, chloride 103, bicarb 23, BUN 27, creatinine 1.45, glucose 167, calcium 8.5. Troponin is slightly elevated at 0.15.   Echo is showing LV ejection fraction 30% to 35%,  moderate to severely increased LV internal cavity size and dilated left atrium, right atrium and mild tricuspid regurgitation. Renal ultrasound revealing mild left renal atrophy, no obstructive uropathy seen. WBC 10.9, hemoglobin 16.6, hematocrit 48.8, platelet count 157.   LDL 104, HDL 25, triglycerides 178, total cholesterol 165. Urinalysis with 3+ leuk esterase, 1+ bacteria, 300 WBCs. Urine cultures growing strep agalactiae. Blood cultures on admission remain negative. Chest x-ray on admission showing no acute disease of the chest.   ASSESSMENT AND PLAN:  The patient is a 81 year old Caucasian male with a past medical history significant for multiple back surgeries, with poor mobility at baseline, chronic obstructive pulmonary disease, might require home oxygen, but has ongoing smoking, history of chronic systolic CHF, EF of 30% to 35%, CAD in the past, and prior benign prostatic trophy with urinary retention, requiring catheterizations intermittently. Comes to the hospital secondary to not feeling well. He was found to have elevated troponin and difficulty breathing, and also urinary tract infection, and was admitted for the same.    1.  Systemic inflammatory response syndrome on  admission secondary to urinary tract infection and bronchitis. Urine cultures were growing strep agalactiae. He was treated with IV antibiotics while in the hospital, and will be going home on Levaquin for his bronchitis.   2.  Urinary retention. The patient required Foley catheter on admission, and he said he has history of urinary retention with BPH, and was supposed to follow up with urologist, and has had history of intermittent catheterizations. He refused to get the Foley  catheter out, and he will be seeing Dr. Achilles Dunkope in the office on May 12 and will be discharged on indwelling Foley for now. Complications associated with long-term catheterization have been explained, and patient is aware of this. Also, his daughter was notified of the same, and she agrees with the plan.   3.  Ongoing smoking. The patient smokes about 1 to 2 packs per day. Continues to smoke and not ready to quit at this time. He was on nicotine patch while in the hospital.   4.  Elevated troponin secondary to demand ischemia. Seen by Dr. Lady GaryFath in the hospital. Probably respiratory distress brought it on. Echo showed EF of 30% to 35%. Continue medical management, as patient has been noncompliant with his medications.  5.  Chronic systolic congestive heart failure. He is on aspirin, lisinopril, statin, and also atenolol. He is also on low-dose Lasix 20 mg p.o. daily.   6.  Patient has poor mobility status at baseline due to his multiple back surgeries, and lives at home with his daughter. Home health was recommended; however, he refused home health  services at this time. His course has been otherwise uneventful in the hospital. High risk for rehospitalization because of chronic O2 dependence, but not using oxygen, and ongoing smoking at this time.   DISCHARGE CONDITION:  Guarded.   DISCHARGE DISPOSITION:  Home.   Time spent on discharge is 40 minutes.   ____________________________ Enid Baasadhika Korrin Waterfield, MD rk:mr D: 11/29/2012 16:05:00 ET T: 11/29/2012 19:57:46 ET JOB#: [id number]  cc: Enid Baasadhika Maleaha Hughett, MD, <Dictator> Enid BaasADHIKA Inocente Krach MD ELECTRONICALLY SIGNED 12/01/2012 15:42

## 2014-11-23 NOTE — Consult Note (Signed)
   General Aspect 81 yo male with history of cad s/p pci in the past who was admitted with progressive shortness of breath, weakness and chest tightness. He states he has nottaken any of his medication for the past 4-6 weeks. He was noted to have mild troponin elevation to 0.15. EKG revals sr with no signficant ischemia. He has improved iwth oxygen and resumption of his medicaitons.   Physical Exam:  GEN obese   HEENT PERRL   NECK supple   RESP no use of accessory muscles  rhonchi   CARD Regular rate and rhythm  Normal, S1, S2  Murmur   Murmur Systolic   Systolic Murmur axilla   ABD denies tenderness  no Adominal Mass   LYMPH negative neck   EXTR negative cyanosis/clubbing, negative edema   SKIN normal to palpation   NEURO cranial nerves intact, motor/sensory function intact   PSYCH poor insight   Review of Systems:  General: Fatigue  Weakness   Skin: No Complaints   ENT: No Complaints   Eyes: No Complaints   Neck: No Complaints   Respiratory: Short of breath   Cardiovascular: Chest pain or discomfort  Tightness   Gastrointestinal: No Complaints   Genitourinary: No Complaints   Vascular: No Complaints   Musculoskeletal: No Complaints   Neurologic: No Complaints   Hematologic: No Complaints   Endocrine: No Complaints   Psychiatric: No Complaints   Review of Systems: All other systems were reviewed and found to be negative   Medications/Allergies Reviewed Medications/Allergies reviewed     aortic aneurysm:    MI:    Hypertension:    GERD - Esophageal Reflux:    BPH - Benign Prostatic Hypertrophy:    UTI:    COPD:   EKG:  EKG NSR   Abnormal NSSTTW changes    No Known Allergies:    Impression Pt with history of cad s/p pci in the past admtted after presenting with shortness of breath and chest discomfort. CXR did not reveal any signficant chf. He had a mild troponin elevation consisteant with a nstemi which appars to be seocndary to  demand ischemia due to medical noncomplinace. He is stable and has improved with resumption of his meds.   Plan 1. Stressed importance of medical compliance 2. Would conintue current meds and defer invasive evaluation for his nstemi at this point.  3 will review echo to evalaute lv funciton 4. Ambulate in am and consider discharge if stable.   Electronic Signatures: Dalia HeadingFath, Alejah Aristizabal A (MD)  (Signed 27-Apr-14 22:34)  Authored: General Aspect/Present Illness, History and Physical Exam, Review of System, Past Medical History, EKG , Allergies, Impression/Plan   Last Updated: 27-Apr-14 22:34 by Dalia HeadingFath, Anabela Crayton A (MD)
